# Patient Record
Sex: Male | Born: 1990 | Race: Black or African American | Hispanic: No | Marital: Single | Smoking: Former smoker
Health system: Southern US, Community
[De-identification: ages and names within clinical notes are randomized; demographics above are authoritative.]

---

## 2016-04-07 ENCOUNTER — Emergency Department (HOSPITAL_BASED_OUTPATIENT_CLINIC_OR_DEPARTMENT_OTHER)
Admission: EM | Admit: 2016-04-07 | Discharge: 2016-04-07 | Disposition: A | Discharge status: Home / Self Care | Payer: Self-pay | Attending: Emergency Medicine | Admitting: Emergency Medicine

## 2016-04-07 ENCOUNTER — Encounter (HOSPITAL_BASED_OUTPATIENT_CLINIC_OR_DEPARTMENT_OTHER): Payer: Self-pay | Admitting: Emergency Medicine

## 2016-04-07 DIAGNOSIS — R112 Nausea with vomiting, unspecified: Secondary | ICD-10-CM | POA: Insufficient documentation

## 2016-04-07 DIAGNOSIS — F17210 Nicotine dependence, cigarettes, uncomplicated: Secondary | ICD-10-CM | POA: Insufficient documentation

## 2016-04-07 DIAGNOSIS — Z79899 Other long term (current) drug therapy: Secondary | ICD-10-CM | POA: Insufficient documentation

## 2016-04-07 DIAGNOSIS — R109 Unspecified abdominal pain: Secondary | ICD-10-CM | POA: Insufficient documentation

## 2016-04-07 MED ORDER — GI COCKTAIL ~~LOC~~
30.0000 mL | Freq: Once | ORAL | Status: AC
  Administered 2016-04-07: 30 mL via ORAL
  Filled 2016-04-07: qty 30

## 2016-04-07 MED ORDER — ONDANSETRON 4 MG PO TBDP: ORAL_TABLET | ORAL | 0 refills | Status: AC

## 2016-04-07 MED ORDER — ACETAMINOPHEN 325 MG PO TABS
650.0000 mg | ORAL_TABLET | Freq: Once | ORAL | Status: AC
  Administered 2016-04-07: 650 mg via ORAL
  Filled 2016-04-07: qty 2

## 2016-04-07 MED ORDER — ONDANSETRON HCL 4 MG/2ML IJ SOLN
4.0000 mg | Freq: Once | INTRAMUSCULAR | Status: AC
  Administered 2016-04-07: 4 mg via INTRAVENOUS
  Filled 2016-04-07: qty 2

## 2016-04-07 MED ORDER — SODIUM CHLORIDE 0.9 % IV BOLUS (SEPSIS)
1000.0000 mL | Freq: Once | INTRAVENOUS | Status: AC
  Administered 2016-04-07: 1000 mL via INTRAVENOUS

## 2016-04-07 NOTE — ED Triage Notes (Addendum)
Patient states he thinks he has alcohol poisoning. He was drinking last night, qty unknown, and now he can not hold down liquids. Patient has not attempted to eat.   Patient states he can not use the bathroom.   Patient also c/o headache, he took 1 tylenol earlier today
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
## 2017-07-13 ENCOUNTER — Encounter (HOSPITAL_BASED_OUTPATIENT_CLINIC_OR_DEPARTMENT_OTHER): Payer: Self-pay | Admitting: Emergency Medicine

## 2017-07-13 ENCOUNTER — Other Ambulatory Visit: Payer: Self-pay

## 2017-07-13 ENCOUNTER — Emergency Department (HOSPITAL_BASED_OUTPATIENT_CLINIC_OR_DEPARTMENT_OTHER)
Admission: EM | Admit: 2017-07-13 | Discharge: 2017-07-13 | Disposition: A | Discharge status: Home / Self Care | Payer: Self-pay | Attending: Emergency Medicine | Admitting: Emergency Medicine

## 2017-07-13 DIAGNOSIS — Z5321 Procedure and treatment not carried out due to patient leaving prior to being seen by health care provider: Secondary | ICD-10-CM | POA: Insufficient documentation

## 2017-07-13 DIAGNOSIS — Z202 Contact with and (suspected) exposure to infections with a predominantly sexual mode of transmission: Secondary | ICD-10-CM | POA: Insufficient documentation

## 2017-07-13 NOTE — ED Triage Notes (Addendum)
Patient states that he is having tingling sensations to his penis and "body" - he states that "it feels funny" - patient reports recent sexual activity   - patient is to busy texting on his phone to pay attention to the triage questions. Also states "what ya gonna do given me a shot or somethin"
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
## 2017-07-14 NOTE — ED Provider Notes (Signed)
Patient was here for evaluation of exposure to STD.  Patient was in the next room when I was treating another patient for similar symptoms.  Patient got up from the chair and stated that he did not want that evaluation done.  He just wanted the treatment.  I explained to patient that he could not have any treatment until he was fully evaluated.  Patient declined to be evaluated at this time and left without being seen.  I did not participate in the care of this patient.  Graciella FreerLindsey Layden, PA-C   Maxwell CaulLayden, Lindsey A, PA-C 07/14/17 Valentina Lucks0032    Pricilla LovelessGoldston, Scott, MD 07/14/17 [phone]

## 2017-07-15 NOTE — ED Notes (Signed)
07/15/2017, Follow-up call completed. 

## 2017-08-28 ENCOUNTER — Emergency Department (HOSPITAL_COMMUNITY): Payer: No Typology Code available for payment source

## 2017-08-28 ENCOUNTER — Emergency Department (HOSPITAL_COMMUNITY)
Admission: EM | Admit: 2017-08-28 | Discharge: 2017-08-28 | Disposition: A | Discharge status: Home / Self Care | Payer: No Typology Code available for payment source | Attending: Emergency Medicine | Admitting: Emergency Medicine

## 2017-08-28 ENCOUNTER — Encounter (HOSPITAL_COMMUNITY): Payer: Self-pay

## 2017-08-28 DIAGNOSIS — S3991XA Unspecified injury of abdomen, initial encounter: Secondary | ICD-10-CM | POA: Diagnosis not present

## 2017-08-28 DIAGNOSIS — F17210 Nicotine dependence, cigarettes, uncomplicated: Secondary | ICD-10-CM | POA: Diagnosis not present

## 2017-08-28 DIAGNOSIS — Y92410 Unspecified street and highway as the place of occurrence of the external cause: Secondary | ICD-10-CM | POA: Diagnosis not present

## 2017-08-28 DIAGNOSIS — T07XXXA Unspecified multiple injuries, initial encounter: Secondary | ICD-10-CM

## 2017-08-28 DIAGNOSIS — Y999 Unspecified external cause status: Secondary | ICD-10-CM | POA: Diagnosis not present

## 2017-08-28 DIAGNOSIS — Y9389 Activity, other specified: Secondary | ICD-10-CM | POA: Diagnosis not present

## 2017-08-28 DIAGNOSIS — S3993XA Unspecified injury of pelvis, initial encounter: Secondary | ICD-10-CM | POA: Insufficient documentation

## 2017-08-28 DIAGNOSIS — T148XXA Other injury of unspecified body region, initial encounter: Secondary | ICD-10-CM | POA: Diagnosis not present

## 2017-08-28 DIAGNOSIS — S0990XA Unspecified injury of head, initial encounter: Secondary | ICD-10-CM | POA: Diagnosis present

## 2017-08-28 DIAGNOSIS — S299XXA Unspecified injury of thorax, initial encounter: Secondary | ICD-10-CM | POA: Diagnosis not present

## 2017-08-28 LAB — CBC WITH DIFFERENTIAL/PLATELET
BASOS ABS: 0 10*3/uL (ref 0.0–0.1)
Basophils Relative: 0 %
Eosinophils Absolute: 0 10*3/uL (ref 0.0–0.7)
Eosinophils Relative: 1 %
HEMATOCRIT: 44.5 % (ref 39.0–52.0)
HEMOGLOBIN: 15.1 g/dL (ref 13.0–17.0)
Lymphocytes Relative: 35 %
Lymphs Abs: 2.1 10*3/uL (ref 0.7–4.0)
MCH: 25.9 pg — ABNORMAL LOW (ref 26.0–34.0)
MCHC: 33.9 g/dL (ref 30.0–36.0)
MCV: 76.5 fL — ABNORMAL LOW (ref 78.0–100.0)
MONO ABS: 0.4 10*3/uL (ref 0.1–1.0)
MONOS PCT: 6 %
NEUTROS ABS: 3.5 10*3/uL (ref 1.7–7.7)
Neutrophils Relative %: 58 %
Platelets: 220 10*3/uL (ref 150–400)
RBC: 5.82 MIL/uL — ABNORMAL HIGH (ref 4.22–5.81)
RDW: 13.1 % (ref 11.5–15.5)
WBC: 6 10*3/uL (ref 4.0–10.5)

## 2017-08-28 LAB — COMPREHENSIVE METABOLIC PANEL
ALK PHOS: 69 U/L (ref 38–126)
ALT: 25 U/L (ref 17–63)
AST: 33 U/L (ref 15–41)
Albumin: 4.6 g/dL (ref 3.5–5.0)
Anion gap: 11 (ref 5–15)
BUN: 14 mg/dL (ref 6–20)
CALCIUM: 9.4 mg/dL (ref 8.9–10.3)
CO2: 26 mmol/L (ref 22–32)
Chloride: 102 mmol/L (ref 101–111)
Creatinine, Ser: 1.01 mg/dL (ref 0.61–1.24)
GFR calc Af Amer: >60 mL/min (ref >60)
Glucose, Bld: 100 mg/dL — ABNORMAL HIGH (ref 65–99)
POTASSIUM: 3.5 mmol/L (ref 3.5–5.1)
Sodium: 139 mmol/L (ref 135–145)
TOTAL PROTEIN: 7.5 g/dL (ref 6.5–8.1)
Total Bilirubin: 0.6 mg/dL (ref 0.3–1.2)

## 2017-08-28 LAB — LIPASE, BLOOD: LIPASE: 31 U/L (ref 11–51)

## 2017-08-28 MED ORDER — IBUPROFEN 600 MG PO TABS: 600.0000 mg | ORAL_TABLET | Freq: Four times a day (QID) | ORAL | 0 refills | Status: AC | PRN

## 2017-08-28 MED ORDER — IOHEXOL 300 MG/ML  SOLN
100.0000 mL | Freq: Once | INTRAMUSCULAR | Status: AC | PRN
  Administered 2017-08-28: 100 mL via INTRAVENOUS

## 2017-08-28 MED ORDER — MORPHINE SULFATE (PF) 4 MG/ML IV SOLN
4.0000 mg | Freq: Once | INTRAVENOUS | Status: AC
  Administered 2017-08-28: 4 mg via INTRAVENOUS
  Filled 2017-08-28: qty 1
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
## 2017-08-28 NOTE — ED Notes (Signed)
ED Provider at bedside. 

## 2017-08-28 NOTE — ED Notes (Signed)
RN attempted x 3 to ambulate pt;  Patient is arousable and responds but falls back to sleep-Monique,RN
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
## 2017-08-28 NOTE — ED Notes (Signed)
Patient transported to CT 
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
## 2017-09-01 ENCOUNTER — Other Ambulatory Visit: Payer: Self-pay

## 2017-09-01 ENCOUNTER — Encounter (HOSPITAL_BASED_OUTPATIENT_CLINIC_OR_DEPARTMENT_OTHER): Payer: Self-pay

## 2017-09-01 ENCOUNTER — Emergency Department (HOSPITAL_BASED_OUTPATIENT_CLINIC_OR_DEPARTMENT_OTHER)
Admission: EM | Admit: 2017-09-01 | Discharge: 2017-09-01 | Disposition: A | Discharge status: Home / Self Care | Payer: No Typology Code available for payment source | Attending: Emergency Medicine | Admitting: Emergency Medicine

## 2017-09-01 DIAGNOSIS — S161XXD Strain of muscle, fascia and tendon at neck level, subsequent encounter: Secondary | ICD-10-CM | POA: Diagnosis not present

## 2017-09-01 DIAGNOSIS — Z79899 Other long term (current) drug therapy: Secondary | ICD-10-CM | POA: Insufficient documentation

## 2017-09-01 DIAGNOSIS — R51 Headache: Secondary | ICD-10-CM | POA: Diagnosis present

## 2017-09-01 DIAGNOSIS — R0789 Other chest pain: Secondary | ICD-10-CM | POA: Diagnosis not present

## 2017-09-01 DIAGNOSIS — S060X0D Concussion without loss of consciousness, subsequent encounter: Secondary | ICD-10-CM | POA: Insufficient documentation

## 2017-09-01 MED ORDER — TRAMADOL HCL 50 MG PO TABS: 50.0000 mg | ORAL_TABLET | Freq: Four times a day (QID) | ORAL | 0 refills | Status: AC | PRN

## 2017-09-01 MED ORDER — KETOROLAC TROMETHAMINE 60 MG/2ML IM SOLN
60.0000 mg | Freq: Once | INTRAMUSCULAR | Status: AC
  Administered 2017-09-01: 60 mg via INTRAMUSCULAR
  Filled 2017-09-01: qty 2

## 2017-09-01 MED ORDER — CYCLOBENZAPRINE HCL 10 MG PO TABS: 10.0000 mg | ORAL_TABLET | Freq: Every day | ORAL | 0 refills | Status: DC
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
## 2019-10-03 ENCOUNTER — Encounter (HOSPITAL_BASED_OUTPATIENT_CLINIC_OR_DEPARTMENT_OTHER): Payer: Self-pay

## 2019-10-03 ENCOUNTER — Emergency Department (HOSPITAL_BASED_OUTPATIENT_CLINIC_OR_DEPARTMENT_OTHER)
Admission: EM | Admit: 2019-10-03 | Discharge: 2019-10-04 | Disposition: A | Discharge status: Home / Self Care | Payer: Self-pay | Attending: Emergency Medicine | Admitting: Emergency Medicine

## 2019-10-03 ENCOUNTER — Emergency Department (HOSPITAL_BASED_OUTPATIENT_CLINIC_OR_DEPARTMENT_OTHER): Payer: Self-pay

## 2019-10-03 ENCOUNTER — Other Ambulatory Visit: Payer: Self-pay

## 2019-10-03 DIAGNOSIS — Z113 Encounter for screening for infections with a predominantly sexual mode of transmission: Secondary | ICD-10-CM | POA: Insufficient documentation

## 2019-10-03 DIAGNOSIS — N3000 Acute cystitis without hematuria: Secondary | ICD-10-CM | POA: Insufficient documentation

## 2019-10-03 DIAGNOSIS — M79671 Pain in right foot: Secondary | ICD-10-CM | POA: Insufficient documentation

## 2019-10-03 NOTE — ED Triage Notes (Signed)
Pt c/o pain to right foot from a splint "weeks ago"-foot color WNL-NAD-steady gait

## 2019-10-04 ENCOUNTER — Telehealth (HOSPITAL_BASED_OUTPATIENT_CLINIC_OR_DEPARTMENT_OTHER): Payer: Self-pay | Admitting: Emergency Medicine

## 2019-10-04 LAB — URINALYSIS, MICROSCOPIC (REFLEX): WBC, UA: >50 WBC/hpf (ref 0–5)

## 2019-10-04 LAB — URINALYSIS, ROUTINE W REFLEX MICROSCOPIC
Glucose, UA: NEGATIVE mg/dL
Ketones, ur: 15 mg/dL — AB
Nitrite: NEGATIVE
Protein, ur: 30 mg/dL — AB
Specific Gravity, Urine: >1.03 — ABNORMAL HIGH (ref 1.005–1.030)
pH: 6.5 (ref 5.0–8.0)

## 2019-10-04 MED ORDER — ACETAMINOPHEN 325 MG PO TABS: 650.0000 mg | ORAL_TABLET | Freq: Once | ORAL | Status: AC

## 2019-10-04 MED ORDER — CEPHALEXIN 500 MG PO CAPS: 500.0000 mg | ORAL_CAPSULE | Freq: Four times a day (QID) | ORAL | 0 refills | Status: AC

## 2019-10-04 MED ORDER — ACETAMINOPHEN 325 MG PO TABS
ORAL_TABLET | ORAL | Status: AC
  Administered 2019-10-04: 650 mg via ORAL
  Filled 2019-10-04: qty 2

## 2019-10-04 MED ORDER — DOXYCYCLINE HYCLATE 100 MG PO CAPS
100.0000 mg | ORAL_CAPSULE | Freq: Two times a day (BID) | ORAL | 0 refills | Status: AC
Start: 2019-10-04 — End: ?

## 2019-10-04 NOTE — ED Provider Notes (Signed)
[place] [facility] EMERGENCY DEPARTMENT Provider Note   CSN: [id number] Arrival date & time: 10/03/19  2017     History Chief Complaint  Patient presents with  . Foot Pain    Ryan Parrish is a 29 y.o. male.  The history is provided by the patient.  Foot Pain This is a new problem. The current episode started more than 1 week ago (months ). The problem occurs constantly. The problem has not changed since onset.Pertinent negatives include no chest pain, no abdominal pain, no headaches and no shortness of breath. Nothing aggravates the symptoms. Nothing relieves the symptoms. He has tried nothing for the symptoms. The treatment provided no relief.  Scab on the right great toe for months.  No associated injuries.       History reviewed. No pertinent past medical history.  There are no problems to display for this patient.   History reviewed. No pertinent surgical history.     No family history on file.  Social History   Tobacco Use  . Smoking status: Never Smoker  . Smokeless tobacco: Never Used  Substance Use Topics  . Alcohol use: Not Currently  . Drug use: No    Home Medications Prior to Admission medications   Medication Sig Start Date End Date Taking? Authorizing Provider  acetaminophen (TYLENOL) 325 MG tablet Take 650 mg by mouth every 6 (six) hours as needed.    [provider]  cyclobenzaprine (FLEXERIL) 10 MG tablet Take 1 tablet (10 mg total) by mouth at bedtime. 09/01/17   Lawyer, Harrell Gave, PA-C  ibuprofen (ADVIL,MOTRIN) 600 MG tablet Take 1 tablet (600 mg total) by mouth every 6 (six) hours as needed. 08/28/17   Ezequiel Essex, MD  ondansetron (ZOFRAN ODT) 4 MG disintegrating tablet 4mg  ODT q4 hours prn nausea/vomit 04/07/16   Deno Etienne, DO  traMADol (ULTRAM) 50 MG tablet Take 1 tablet (50 mg total) by mouth every 6 (six) hours as needed for severe pain. 09/01/17   Lawyer, Harrell Gave, PA-C    Allergies    Patient has no known  allergies.  Review of Systems   Review of Systems  Constitutional: Negative for fever.  HENT: Negative for congestion.   Eyes: Negative for visual disturbance.  Respiratory: Negative for shortness of breath.   Cardiovascular: Negative for chest pain.  Gastrointestinal: Negative for abdominal pain.  Musculoskeletal: Negative for arthralgias.  Skin: Negative for wound.  Neurological: Negative for headaches.  Psychiatric/Behavioral: Negative for agitation.  All other systems reviewed and are negative.   Physical Exam Updated Vital Signs BP (!) 138/96 (BP Location: Right Arm)   Pulse 64   Temp 98.3 F (36.8 C) (Oral)   Resp 19   SpO2 100%   Physical Exam Vitals and nursing note reviewed.  Constitutional:      Appearance: Normal appearance.  HENT:     Head: Normocephalic and atraumatic.     Nose: Nose normal.  Eyes:     Conjunctiva/sclera: Conjunctivae normal.     Pupils: Pupils are equal, round, and reactive to light.  Cardiovascular:     Rate and Rhythm: Normal rate and regular rhythm.     Pulses: Normal pulses.     Heart sounds: Normal heart sounds.  Pulmonary:     Effort: Pulmonary effort is normal.     Breath sounds: Normal breath sounds.  Abdominal:     General: Abdomen is flat. Bowel sounds are normal.     Tenderness: There is no abdominal tenderness. There is no guarding.  Musculoskeletal:        General: No tenderness or deformity. Normal range of motion.     Cervical back: Normal range of motion and neck supple.     Right ankle: Normal.     Right Achilles Tendon: Normal.     Right foot: Normal.  Skin:    General: Skin is warm and dry.     Capillary Refill: Capillary refill takes less than 2 seconds.  Neurological:     General: No focal deficit present.     Mental Status: He is alert and oriented to person, place, and time.     Deep Tendon Reflexes: Reflexes normal.  Psychiatric:        Mood and Affect: Mood normal.        Behavior: Behavior normal.      ED Results / Procedures / Treatments   Labs (all labs ordered are listed, but only abnormal results are displayed) Labs Reviewed - No data to display  EKG None  Radiology DG Foot Complete Right  Result Date: 10/03/2019 CLINICAL DATA:  Injury, splinter of the medial aspect great toe for 1 month EXAM: RIGHT FOOT COMPLETE - 3+ VIEW COMPARISON:  None. FINDINGS: No soft tissue gas or foreign body. No acute bony abnormality. Specifically, no fracture, subluxation, or dislocation. No worrisome osseous erosion, destructive change or periostitis to suggest features of osteomyelitis. Mild hallux valgus deformity approximately 30 degrees. 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 Midfoot and hindfoot alignment is grossly preserved though incompletely assessed on nonweightbearing films. IMPRESSION: 1. No acute osseous abnormality. No soft tissue gas or radiodense foreign body. If the foreign body is of a radiolucent material (such as wood) soft tissue ultrasound could be obtained for further assessment. 2. Mild hallux valgus deformity. 3. No radiographic evidence of osteomyelitis. Electronically Signed   By: Kreg Shropshire M.D.   On: 10/03/2019 23:51    Procedures Procedures (including critical care time)  Medications Ordered in ED Medications - No data to display  ED Course  I have reviewed the triage vital signs and the nursing notes.  Pertinent labs & imaging results that were available during my care of the patient were reviewed by me and considered in my medical decision making (see chart for details).    At discharge patient then stated he has urinary symptoms.  Urine sent for GC and chlamydia.  RX for keflex for UTI and doxycyline sent to patient's pharmacy.  To be called with results of STD tests.  The area of the foot that has been there for months is not infected.  No FB, it is a scab.  No intervention necessary at this time. Patient is stable for discharge  with close follow up.  Ryan Parrish was evaluated in Emergency Department on 10/04/2019 for the symptoms described in the history of present illness. He was evaluated in the context of the global COVID-19 pandemic, which necessitated consideration that the patient might be at risk for infection with the SARS-CoV-2 virus that causes COVID-19. Institutional protocols and algorithms that pertain to the evaluation of patients at risk for COVID-19 are in a state of rapid change based on information released by regulatory bodies including the CDC and federal and state organizations. These policies and algorithms were followed during the patient's care in the ED.  Final Clinical Impression(s) / ED Diagnoses Final diagnoses:  Foot pain, right    Return for intractable cough, coughing up blood,fevers >100.4 unrelieved by medication, shortness of  breath, intractable vomiting, chest pain, shortness of breath, weakness,numbness, changes in speech, facial asymmetry,abdominal pain, passing out,Inability to tolerate liquids or food, cough, altered mental status or any concerns. No signs of systemic illness or infection. The patient is nontoxic-appearing on exam and vital signs are within normal limits.   I have reviewed the triage vital signs and the nursing notes. Pertinent labs &imaging results that were available during my care of the patient were reviewed by me and considered in my medical decision making (see chart for details).After history, exam, and medical workup I feel the patient has beenappropriately medically screened and is safe for discharge home. Pertinent diagnoses were discussed with the patient. Patient was given return precautions.      Joyce Heitman, MD 10/04/19 [phone]

## 2019-10-04 NOTE — Telephone Encounter (Signed)
Prescription for doxycycline and keflex printed by Dr Nicanor Alcon after patient was discharged for treatment due to urinalysis results. Called patient for preferred pharmacy; faxed prescriptions to [facility] [address] in [facility] per patient's request. Advised patient to take until gone and to follow up on GC Chlamydia results as instructed at time of discharge. Patient verbalized understanding.

## 2019-10-05 LAB — GC/CHLAMYDIA PROBE AMP (~~LOC~~) NOT AT ARMC
Chlamydia: NEGATIVE
Comment: NEGATIVE
Comment: NORMAL
Neisseria Gonorrhea: POSITIVE — AB

## 2021-02-14 ENCOUNTER — Other Ambulatory Visit: Payer: Self-pay

## 2021-02-14 ENCOUNTER — Emergency Department (HOSPITAL_BASED_OUTPATIENT_CLINIC_OR_DEPARTMENT_OTHER): Payer: No Typology Code available for payment source

## 2021-02-14 ENCOUNTER — Emergency Department (HOSPITAL_BASED_OUTPATIENT_CLINIC_OR_DEPARTMENT_OTHER)
Admission: EM | Admit: 2021-02-14 | Discharge: 2021-02-14 | Disposition: A | Discharge status: Home / Self Care | Payer: No Typology Code available for payment source | Attending: Emergency Medicine | Admitting: Emergency Medicine

## 2021-02-14 ENCOUNTER — Encounter (HOSPITAL_BASED_OUTPATIENT_CLINIC_OR_DEPARTMENT_OTHER): Payer: Self-pay | Admitting: Emergency Medicine

## 2021-02-14 DIAGNOSIS — M25522 Pain in left elbow: Secondary | ICD-10-CM | POA: Insufficient documentation

## 2021-02-14 DIAGNOSIS — S161XXA Strain of muscle, fascia and tendon at neck level, initial encounter: Secondary | ICD-10-CM | POA: Insufficient documentation

## 2021-02-14 DIAGNOSIS — R519 Headache, unspecified: Secondary | ICD-10-CM | POA: Diagnosis not present

## 2021-02-14 DIAGNOSIS — M79642 Pain in left hand: Secondary | ICD-10-CM | POA: Insufficient documentation

## 2021-02-14 DIAGNOSIS — Y92410 Unspecified street and highway as the place of occurrence of the external cause: Secondary | ICD-10-CM | POA: Insufficient documentation

## 2021-02-14 DIAGNOSIS — M25532 Pain in left wrist: Secondary | ICD-10-CM | POA: Diagnosis not present

## 2021-02-14 DIAGNOSIS — S0001XA Abrasion of scalp, initial encounter: Secondary | ICD-10-CM | POA: Diagnosis not present

## 2021-02-14 DIAGNOSIS — M79602 Pain in left arm: Secondary | ICD-10-CM | POA: Diagnosis not present

## 2021-02-14 DIAGNOSIS — S199XXA Unspecified injury of neck, initial encounter: Secondary | ICD-10-CM | POA: Diagnosis present

## 2021-02-14 MED ORDER — METHOCARBAMOL 500 MG PO TABS
500.0000 mg | ORAL_TABLET | Freq: Once | ORAL | Status: AC
  Administered 2021-02-14: 500 mg via ORAL
  Filled 2021-02-14: qty 1

## 2021-02-14 MED ORDER — METHOCARBAMOL 500 MG PO TABS: 500.0000 mg | ORAL_TABLET | Freq: Two times a day (BID) | ORAL | 0 refills | Status: AC

## 2021-02-14 MED ORDER — ACETAMINOPHEN 325 MG PO TABS
650.0000 mg | ORAL_TABLET | Freq: Once | ORAL | Status: AC
  Administered 2021-02-14: 650 mg via ORAL
  Filled 2021-02-14: qty 2
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
## 2021-02-14 NOTE — ED Notes (Signed)
ED Provider at bedside. 

## 2021-02-18 ENCOUNTER — Emergency Department (HOSPITAL_BASED_OUTPATIENT_CLINIC_OR_DEPARTMENT_OTHER)
Admission: EM | Admit: 2021-02-18 | Discharge: 2021-02-18 | Disposition: A | Discharge status: Home / Self Care | Payer: No Typology Code available for payment source | Attending: Emergency Medicine | Admitting: Emergency Medicine

## 2021-02-18 ENCOUNTER — Other Ambulatory Visit: Payer: Self-pay

## 2021-02-18 ENCOUNTER — Encounter (HOSPITAL_BASED_OUTPATIENT_CLINIC_OR_DEPARTMENT_OTHER): Payer: Self-pay

## 2021-02-18 DIAGNOSIS — Z87891 Personal history of nicotine dependence: Secondary | ICD-10-CM | POA: Diagnosis not present

## 2021-02-18 DIAGNOSIS — M791 Myalgia, unspecified site: Secondary | ICD-10-CM | POA: Insufficient documentation

## 2021-02-18 DIAGNOSIS — Y92410 Unspecified street and highway as the place of occurrence of the external cause: Secondary | ICD-10-CM | POA: Insufficient documentation

## 2021-02-18 DIAGNOSIS — R519 Headache, unspecified: Secondary | ICD-10-CM | POA: Diagnosis not present

## 2021-02-18 MED ORDER — LIDOCAINE 5 % EX PTCH: 1.0000 | MEDICATED_PATCH | CUTANEOUS | 0 refills | Status: AC

## 2021-02-18 MED ORDER — NAPROXEN 500 MG PO TABS
500.0000 mg | ORAL_TABLET | Freq: Two times a day (BID) | ORAL | 0 refills | Status: AC
Start: 2021-02-18 — End: ?
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
## 2021-02-18 NOTE — Discharge Instructions (Addendum)
Follow up with Primary care provider  Return for new or worsening symptoms 
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
## 2021-05-09 ENCOUNTER — Other Ambulatory Visit: Payer: Self-pay

## 2021-05-09 ENCOUNTER — Emergency Department (HOSPITAL_BASED_OUTPATIENT_CLINIC_OR_DEPARTMENT_OTHER)
Admission: EM | Admit: 2021-05-09 | Discharge: 2021-05-09 | Disposition: A | Discharge status: Home / Self Care | Payer: Medicaid Other | Attending: Emergency Medicine | Admitting: Emergency Medicine

## 2021-05-09 ENCOUNTER — Encounter (HOSPITAL_BASED_OUTPATIENT_CLINIC_OR_DEPARTMENT_OTHER): Payer: Self-pay | Admitting: Emergency Medicine

## 2021-05-09 DIAGNOSIS — Z202 Contact with and (suspected) exposure to infections with a predominantly sexual mode of transmission: Secondary | ICD-10-CM | POA: Insufficient documentation

## 2021-05-09 LAB — URINALYSIS, ROUTINE W REFLEX MICROSCOPIC
Bilirubin Urine: NEGATIVE
Glucose, UA: NEGATIVE mg/dL
Hgb urine dipstick: NEGATIVE
Ketones, ur: NEGATIVE mg/dL
Nitrite: NEGATIVE
Protein, ur: NEGATIVE mg/dL
Specific Gravity, Urine: >=1.03 (ref 1.005–1.030)
pH: 6 (ref 5.0–8.0)

## 2021-05-09 LAB — URINALYSIS, MICROSCOPIC (REFLEX)
RBC / HPF: NONE SEEN RBC/hpf (ref 0–5)
Squamous Epithelial / HPF: NONE SEEN (ref 0–5)

## 2021-05-09 NOTE — ED Notes (Signed)
States he was exposed approx 1 month ago. Denies any signs or symptoms

## 2021-05-09 NOTE — ED Provider Notes (Signed)
[facility] EMERGENCY DEPARTMENT Provider Note   CSN: [id number] Arrival date & time: 05/09/21  1210     History  Chief Complaint  Patient presents with   Exposure to STD    Fermon Ureta is a 31 y.o. male.  Patient is a 31 year old male who presents requesting STD testing.  He says that someone that he had intercourse with had exposure to chlamydia.  He just wants to be tested.  He denies any symptoms.  No penile discharge.  No dysuria.  No rashes.      Home Medications Prior to Admission medications   Medication Sig Start Date End Date Taking? Authorizing Provider  acetaminophen (TYLENOL) 325 MG tablet Take 650 mg by mouth every 6 (six) hours as needed.    [provider]  cephALEXin (KEFLEX) 500 MG capsule Take 1 capsule (500 mg total) by mouth 4 (four) times daily. 10/04/19   Palumbo, April, MD  doxycycline (VIBRAMYCIN) 100 MG capsule Take 1 capsule (100 mg total) by mouth 2 (two) times daily. One po bid x 7 days 10/04/19   Palumbo, April, MD  ibuprofen (ADVIL,MOTRIN) 600 MG tablet Take 1 tablet (600 mg total) by mouth every 6 (six) hours as needed. 08/28/17   Rancour, Jeannett Senior, MD  lidocaine (LIDODERM) 5 % Place 1 patch onto the skin daily. Remove & Discard patch within 12 hours or as directed by MD 02/18/21   Henderly, Britni A, PA-C  naproxen (NAPROSYN) 500 MG tablet Take 1 tablet (500 mg total) by mouth 2 (two) times daily. 02/18/21   Henderly, Britni A, PA-C  ondansetron (ZOFRAN ODT) 4 MG disintegrating tablet 4mg  ODT q4 hours prn nausea/vomit 04/07/16   04/09/16, DO  traMADol (ULTRAM) 50 MG tablet Take 1 tablet (50 mg total) by mouth every 6 (six) hours as needed for severe pain. 09/01/17   Lawyer, 11/01/17, PA-C      Allergies    Patient has no known allergies.    Review of Systems   Review of Systems  Constitutional:  Negative for fever.  Gastrointestinal:  Negative for abdominal pain, nausea and vomiting.  Genitourinary:  Negative for genital  sores, penile discharge, penile pain, scrotal swelling and testicular pain.  Skin:  Negative for rash.   Physical Exam Updated Vital Signs BP (!) 139/103 (BP Location: Left Arm)    Pulse 81    Temp 98.4 F (36.9 C) (Oral)    Resp 18    Ht 5\' 3"  (1.6 m)    Wt 45.4 kg    SpO2 100%    BMI 17.71 kg/m  Physical Exam Constitutional:      Appearance: He is well-developed.  HENT:     Head: Normocephalic and atraumatic.  Eyes:     Pupils: Pupils are equal, round, and reactive to light.  Cardiovascular:     Rate and Rhythm: Normal rate and regular rhythm.     Heart sounds: Normal heart sounds.  Pulmonary:     Effort: Pulmonary effort is normal. No respiratory distress.     Breath sounds: Normal breath sounds. No wheezing or rales.  Chest:     Chest wall: No tenderness.  Abdominal:     General: Bowel sounds are normal.     Palpations: Abdomen is soft.     Tenderness: There is no abdominal tenderness. There is no guarding or rebound.  Musculoskeletal:        General: Normal range of motion.     Cervical back: Normal range of  motion and neck supple.  Lymphadenopathy:     Cervical: No cervical adenopathy.  Skin:    General: Skin is warm and dry.     Findings: No rash.  Neurological:     Mental Status: He is alert and oriented to person, place, and time.    ED Results / Procedures / Treatments   Labs (all labs ordered are listed, but only abnormal results are displayed) Labs Reviewed  URINALYSIS, ROUTINE W REFLEX MICROSCOPIC - Abnormal; Notable for the following components:      Result Value   Leukocytes,Ua TRACE (*)    All other components within normal limits  URINALYSIS, MICROSCOPIC (REFLEX) - Abnormal; Notable for the following components:   Bacteria, UA RARE (*)    All other components within normal limits  GC/CHLAMYDIA PROBE AMP ([place]) NOT AT [facility]    EKG None  Radiology No results found.  Procedures Procedures    Medications Ordered in ED Medications - No  data to display  ED Course/ Medical Decision Making/ A&P                           Medical Decision Making    Patient is a 31 year old male who presents with an exposure to an STD.  He is currently asymptomatic.  His urinalysis is nonconcerning.  He does not have any UTI symptoms.  He was tested for [facility] and chlamydia.  He is refusing syphilis and HIV testing.   Final Clinical Impression(s) / ED Diagnoses Final diagnoses:  STD exposure    Rx / DC Orders ED Discharge Orders     None         Rolan Bucco, MD 05/09/21 1428

## 2021-05-09 NOTE — ED Triage Notes (Signed)
Pt arrives pov, reports exposure to chlamydia. Pt denies symptoms

## 2021-05-09 NOTE — ED Notes (Signed)
ED MD informed of orders from MD for HIV/STD lab testing, pt refuses, only wanted to have urine ck'd.

## 2021-05-11 LAB — GC/CHLAMYDIA PROBE AMP (~~LOC~~) NOT AT ARMC
Chlamydia: POSITIVE — AB
Comment: NEGATIVE
Comment: NORMAL
Neisseria Gonorrhea: NEGATIVE
